# Patient Record
Sex: Male | Born: 1985 | Hispanic: No | Smoking: Never smoker
Health system: Southern US, Community
[De-identification: ages and names within clinical notes are randomized; demographics above are authoritative.]

## PROBLEM LIST (undated history)

## (undated) DIAGNOSIS — F419 Anxiety disorder, unspecified: Secondary | ICD-10-CM

## (undated) DIAGNOSIS — E785 Hyperlipidemia, unspecified: Secondary | ICD-10-CM

## (undated) HISTORY — DX: Anxiety disorder, unspecified: F41.9

## (undated) HISTORY — DX: Hyperlipidemia, unspecified: E78.5

---

## 2018-09-27 ENCOUNTER — Other Ambulatory Visit: Payer: Self-pay

## 2018-09-27 DIAGNOSIS — Z20822 Contact with and (suspected) exposure to covid-19: Secondary | ICD-10-CM

## 2018-09-28 LAB — NOVEL CORONAVIRUS, NAA: SARS-CoV-2, NAA: NOT DETECTED

## 2018-10-02 ENCOUNTER — Telehealth: Payer: Self-pay | Admitting: General Practice

## 2018-10-02 NOTE — Telephone Encounter (Signed)
Pt aware covid lab test negative, not detected °

## 2018-12-25 ENCOUNTER — Other Ambulatory Visit: Payer: Self-pay

## 2018-12-25 DIAGNOSIS — Z20822 Contact with and (suspected) exposure to covid-19: Secondary | ICD-10-CM

## 2018-12-27 LAB — NOVEL CORONAVIRUS, NAA: SARS-CoV-2, NAA: NOT DETECTED

## 2019-01-08 ENCOUNTER — Other Ambulatory Visit: Payer: Self-pay

## 2019-01-08 DIAGNOSIS — Z20822 Contact with and (suspected) exposure to covid-19: Secondary | ICD-10-CM

## 2019-01-10 LAB — NOVEL CORONAVIRUS, NAA: SARS-CoV-2, NAA: NOT DETECTED

## 2019-01-28 ENCOUNTER — Ambulatory Visit: Payer: Self-pay | Attending: Internal Medicine

## 2019-01-28 DIAGNOSIS — Z20822 Contact with and (suspected) exposure to covid-19: Secondary | ICD-10-CM

## 2019-01-29 LAB — NOVEL CORONAVIRUS, NAA: SARS-CoV-2, NAA: NOT DETECTED

## 2019-02-12 ENCOUNTER — Ambulatory Visit: Payer: 59 | Attending: Internal Medicine

## 2019-02-12 DIAGNOSIS — Z20822 Contact with and (suspected) exposure to covid-19: Secondary | ICD-10-CM | POA: Insufficient documentation

## 2019-02-14 LAB — NOVEL CORONAVIRUS, NAA: SARS-CoV-2, NAA: NOT DETECTED

## 2019-02-25 ENCOUNTER — Other Ambulatory Visit: Payer: Self-pay | Admitting: General Surgery

## 2019-02-25 DIAGNOSIS — R590 Localized enlarged lymph nodes: Secondary | ICD-10-CM

## 2019-03-01 ENCOUNTER — Other Ambulatory Visit: Payer: Self-pay

## 2019-03-01 ENCOUNTER — Ambulatory Visit
Admission: RE | Admit: 2019-03-01 | Discharge: 2019-03-01 | Disposition: A | Discharge status: Home / Self Care | Payer: 59 | Source: Ambulatory Visit | Attending: General Surgery | Admitting: General Surgery

## 2019-03-01 DIAGNOSIS — R590 Localized enlarged lymph nodes: Secondary | ICD-10-CM | POA: Diagnosis not present

## 2019-04-01 ENCOUNTER — Ambulatory Visit: Payer: 59 | Attending: Internal Medicine

## 2019-04-01 DIAGNOSIS — Z23 Encounter for immunization: Secondary | ICD-10-CM

## 2019-04-01 NOTE — Progress Notes (Signed)
   Covid-19 Vaccination Clinic  Name:  Alejandro Gamel    MRN: [id number] DOB: 20-Nov-1985  04/01/2019  Mr. Whistler was observed post Covid-19 immunization for 15 minutes without incidence. He was provided with Vaccine Information Sheet and instruction to access the V-Safe system.   Mr. Orsino was instructed to call 911 with any severe reactions post vaccine: Marland Kitchen Difficulty breathing  . Swelling of your face and throat  . A fast heartbeat  . A bad rash all over your body  . Dizziness and weakness    Immunizations Administered    Name Date Dose VIS Date Route   Moderna COVID-19 Vaccine 04/01/2019 10:05 AM 0.5 mL 01/08/2019 Intramuscular   Manufacturer: Moderna   Lot: [id number]   NDC: 00762-263-33

## 2019-04-15 ENCOUNTER — Other Ambulatory Visit: Payer: Self-pay | Admitting: Surgery

## 2019-04-15 DIAGNOSIS — R590 Localized enlarged lymph nodes: Secondary | ICD-10-CM

## 2019-04-22 ENCOUNTER — Other Ambulatory Visit: Payer: Self-pay

## 2019-04-22 ENCOUNTER — Ambulatory Visit
Admission: RE | Admit: 2019-04-22 | Discharge: 2019-04-22 | Disposition: A | Discharge status: Home / Self Care | Payer: 59 | Source: Ambulatory Visit | Attending: Surgery | Admitting: Surgery

## 2019-04-22 DIAGNOSIS — R590 Localized enlarged lymph nodes: Secondary | ICD-10-CM | POA: Diagnosis not present

## 2019-04-30 ENCOUNTER — Ambulatory Visit: Payer: 59 | Attending: Internal Medicine

## 2019-04-30 DIAGNOSIS — Z23 Encounter for immunization: Secondary | ICD-10-CM

## 2019-04-30 NOTE — Progress Notes (Signed)
   Covid-19 Vaccination Clinic  Name:  Micheal Ward    MRN: [id number] DOB: 06/23/1985  04/30/2019  [name] was observed post Covid-19 immunization for 15 minutes without incident. He was provided with Vaccine Information Sheet and instruction to access the V-Safe system.   [name] was instructed to call 911 with any severe reactions post vaccine: Marland Kitchen Difficulty breathing  . Swelling of face and throat  . A fast heartbeat  . A bad rash all over body  . Dizziness and weakness   Immunizations Administered    Name Date Dose VIS Date Route   Moderna COVID-19 Vaccine 04/30/2019 10:03 AM 0.5 mL 01/08/2019 Intramuscular   Manufacturer: Gala Murdoch   Lot: [id number]   NDC: 65790-383-33

## 2019-09-10 ENCOUNTER — Other Ambulatory Visit: Payer: Self-pay | Admitting: General Surgery

## 2019-09-10 DIAGNOSIS — R590 Localized enlarged lymph nodes: Secondary | ICD-10-CM

## 2019-09-13 ENCOUNTER — Other Ambulatory Visit: Payer: Self-pay

## 2019-09-13 ENCOUNTER — Ambulatory Visit
Admission: RE | Admit: 2019-09-13 | Discharge: 2019-09-13 | Disposition: A | Discharge status: Home / Self Care | Payer: BC Managed Care – PPO | Source: Ambulatory Visit | Attending: General Surgery | Admitting: General Surgery

## 2019-09-13 DIAGNOSIS — R590 Localized enlarged lymph nodes: Secondary | ICD-10-CM | POA: Insufficient documentation

## 2019-09-19 ENCOUNTER — Other Ambulatory Visit (HOSPITAL_COMMUNITY): Payer: Self-pay | Admitting: General Surgery

## 2019-09-19 ENCOUNTER — Other Ambulatory Visit: Payer: Self-pay | Admitting: General Surgery

## 2019-09-19 DIAGNOSIS — R590 Localized enlarged lymph nodes: Secondary | ICD-10-CM

## 2019-09-20 ENCOUNTER — Other Ambulatory Visit (HOSPITAL_COMMUNITY): Payer: Self-pay | Admitting: General Surgery

## 2019-09-26 ENCOUNTER — Other Ambulatory Visit: Payer: Self-pay

## 2019-09-26 ENCOUNTER — Ambulatory Visit
Admission: RE | Admit: 2019-09-26 | Discharge: 2019-09-26 | Disposition: A | Discharge status: Home / Self Care | Payer: BC Managed Care – PPO | Source: Ambulatory Visit | Attending: General Surgery | Admitting: General Surgery

## 2019-09-26 DIAGNOSIS — R590 Localized enlarged lymph nodes: Secondary | ICD-10-CM | POA: Diagnosis not present

## 2019-09-26 MED ORDER — IOHEXOL 300 MG/ML  SOLN
100.0000 mL | Freq: Once | INTRAMUSCULAR | Status: AC | PRN
  Administered 2019-09-26: 100 mL via INTRAVENOUS

## 2019-11-21 ENCOUNTER — Encounter: Payer: Self-pay | Admitting: Psychiatry

## 2019-11-21 ENCOUNTER — Other Ambulatory Visit: Payer: Self-pay

## 2019-11-21 ENCOUNTER — Telehealth (INDEPENDENT_AMBULATORY_CARE_PROVIDER_SITE_OTHER): Payer: BC Managed Care – PPO | Admitting: Psychiatry

## 2019-11-21 DIAGNOSIS — R41840 Attention and concentration deficit: Secondary | ICD-10-CM

## 2019-11-21 DIAGNOSIS — F411 Generalized anxiety disorder: Secondary | ICD-10-CM

## 2019-11-21 NOTE — Progress Notes (Signed)
Provider Location : [facility] Patient Location : Work  Participants: Patient , Provider  Virtual Visit via Video Note  I connected with Micheal Ward on 11/21/19 at  9:00 AM EDT by a video enabled telemedicine application and verified that I am speaking with the correct person using two identifiers.   I discussed the limitations of evaluation and management by telemedicine and the availability of in person appointments. The patient expressed understanding and agreed to proceed.    I discussed the assessment and treatment plan with the patient. The patient was provided an opportunity to ask questions and all were answered. The patient agreed with the plan and demonstrated an understanding of the instructions.   The patient was advised to call back or seek an in-person evaluation if the symptoms worsen or if the condition fails to improve as anticipated.    Psychiatric Initial Adult Assessment   Patient Identification: Micheal Ward MRN:  [id number] Date of Evaluation:  11/21/2019 Referral Source: Jerl Mina MD Chief Complaint:   Chief Complaint    Establish Care     Visit Diagnosis:    ICD-10-CM   1. Attention and concentration deficit  R41.840 TSH  2. Generalized anxiety disorder  F41.1 TSH    History of Present Illness:  Micheal Ward is a 34 year old Caucasian male, divorced, employed, also a  Consulting civil engineer, lives in [place], has a history of generalized anxiety disorder, attention and concentration deficit, allergic rhinitis, hyperlipidemia was evaluated by telemedicine today.  Patient today reports he was diagnosed with generalized anxiety disorder 3 years ago.  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  Patient reports he was tried on multiple medications in the past including Lexapro, Zoloft as well as Wellbutrin.  Patient however stopped all his medication in February 2021.  Patient reports he is able to manage his anxiety symptoms well with  meditation and relaxation techniques and it does not affect his functioning.  Patient however today reports he is currently struggling with a lot of attention and focus problems.  Patient reports he has difficulty paying attention even when someone speaks to him directly, being forgetful, his mind wanders often, procrastination and so on.  Patient reports he also had similar problems growing up in elementary school and middle school.  Patient reports he used to daydream a lot and had difficulty doing his homework.  Patient however reports he was never tested for ADHD.  He is currently enrolled in a masters program as well as works a full-time job.  Patient  would like to get tested for ADHD and wants to be managed.  Patient denies any significant depressive symptoms at this time.  Patient denies any suicidality, homicidality or perceptual disturbances.  Patient denies any history of trauma.  Patient denies any manic or hypomanic symptoms.  Patient denies any substance abuse problems.   Associated Signs/Symptoms: Depression Symptoms:  anxiety, (Hypo) Manic Symptoms:  Denies Anxiety Symptoms:  Excessive Worry, Psychotic Symptoms:  Denies PTSD Symptoms: Negative  Past Psychiatric History: Patient with past history of GAD.  Patient denies suicide attempts.  Patient denies inpatient mental health admissions.  Previous Psychotropic Medications: Yes He was treated for generalized anxiety disorder by his primary care provider 3 years ago-Lexapro, sertraline, Wellbutrin.  Substance Abuse History in the last 12 months:  No.  Consequences of Substance Abuse: Negative  Past Medical History:  Past Medical History:  Diagnosis Date  . Anxiety   . Hyperlipidemia    History reviewed. No pertinent  surgical history.  Family Psychiatric History: Sister-depression  Family History:  Family History  Problem Relation Age of Onset  . Depression Sister     Social History:   Social History    Socioeconomic History  . Marital status: Divorced    Spouse name: Not on file  . Number of children: Not on file  . Years of education: Not on file  . Highest education level: Not on file  Occupational History  . Not on file  Tobacco Use  . Smoking status: Never Smoker  . Smokeless tobacco: Never Used  Substance and Sexual Activity  . Alcohol use: Not on file  . Drug use: Not on file  . Sexual activity: Not on file  Other Topics Concern  . Not on file  Social History Narrative  . Not on file   Social Determinants of Health   Financial Resource Strain:   . Difficulty of Paying Living Expenses: Not on file  Food Insecurity:   . Worried About Programme researcher, broadcasting/film/video in the Last Year: Not on file  . Ran Out of Food in the Last Year: Not on file  Transportation Needs:   . Lack of Transportation (Medical): Not on file  . Lack of Transportation (Non-Medical): Not on file  Physical Activity:   . Days of Exercise per Week: Not on file  . Minutes of Exercise per Session: Not on file  Stress:   . Feeling of Stress : Not on file  Social Connections:   . Frequency of Communication with Friends and Family: Not on file  . Frequency of Social Gatherings with Friends and Family: Not on file  . Attends Religious Services: Not on file  . Active Member of Clubs or Organizations: Not on file  . Attends Banker Meetings: Not on file  . Marital Status: Not on file    Additional Social History: Patient was raised by both parents.  He reports he had a very good childhood.  He has a lot of pleasant childhood memories and calls himself fortunate.  Patient currently is enrolled in a masters program.  He currently works as a Merchandiser, retail.  Patient is divorced.  He has no children.  He is currently in a relationship and reports that is going well.  He lives in [place].  He denies any legal problems.  He denies any history of trauma.  Allergies:  No Known  Allergies  Metabolic Disorder Labs: No results found for: HGBA1C, MPG No results found for: PROLACTIN No results found for: CHOL, TRIG, HDL, CHOLHDL, VLDL, LDLCALC No results found for: TSH  Therapeutic Level Labs: No results found for: LITHIUM No results found for: CBMZ No results found for: VALPROATE  Current Medications: Current Outpatient Medications  Medication Sig Dispense Refill  . chlorhexidine (PERIDEX) 0.12 % solution 15 mLs 2 (two) times daily.    . fenofibrate (TRICOR) 145 MG tablet Take by mouth.    Marland Kitchen HYDROcodone-acetaminophen (NORCO/VICODIN) 5-325 MG tablet Take by mouth.    . lansoprazole (PREVACID) 30 MG capsule Take by mouth.    . montelukast (SINGULAIR) 10 MG tablet Take 10 mg by mouth daily.    Marland Kitchen triamcinolone cream (KENALOG) 0.1 % Apply topically 2 (two) times daily.     No current facility-administered medications for this visit.    Musculoskeletal: Strength & Muscle Tone: UTA Gait & Station: normal Patient leans: N/A  Psychiatric Specialty Exam: Review of Systems  Psychiatric/Behavioral: Positive for decreased concentration. The patient  is nervous/anxious.   All other systems reviewed and are negative.   There were no vitals taken for this visit.There is no height or weight on file to calculate BMI.  General Appearance: Casual  Eye Contact:  Fair  Speech:  Clear and Coherent  Volume:  Normal  Mood:  Anxious  Affect:  Congruent  Thought Process:  Goal Directed and Descriptions of Associations: Intact  Orientation:  Full (Time, Place, and Person)  Thought Content:  Logical  Suicidal Thoughts:  No  Homicidal Thoughts:  No  Memory:  Immediate;   Fair Recent;   Fair Remote;   Fair  Judgement:  Fair  Insight:  Fair  Psychomotor Activity:  Normal  Concentration:  Concentration: Fair and Attention Span: Fair  Recall:  [employer] of Knowledge:Fair  Language: Fair  Akathisia:  No  Handed:  Right  AIMS (if indicated):  UTA  Assets:   Communication Skills Desire for Improvement Housing Social Support  ADL's:  Intact  Cognition: WNL  Sleep:  Fair   Screenings: GAD-7     Video Visit from 11/21/2019 in [facility]  Total GAD-7 Score 7      Assessment and Plan: Stclair Szymborski is a 34 year old Caucasian male, employed, currently enrolled in a masters program, divorced, lives in [place], has a history of anxiety, attention and focus deficit, allergic rhinitis, hyperlipidemia was evaluated by telemedicine today.  Patient currently struggles with attention and focus problems, which may have started in elementary school, currently getting worse.  Patient will benefit from ADHD testing.  Plan as noted below.  Plan Attention and focus deficit-unstable Patient completed adult ADHD rating scale-scored high on the same. Will refer for ADHD testing today.   GAD-stable Patient currently is not on medications and managing his anxiety with meditation, relaxation techniques. He is not interested in medications today. GAD 7 today equals 7  We will order labs-TSH.  Follow-up in clinic in 4 to 6 weeks after ADHD testing.  I have spent atleast 35 minutes face to face by video with patient today. More than 50 % of the time was spent for preparing to see the patient ( e.g., review of test, records ), ordering medications and test ,psychoeducation and supportive psychotherapy and care coordination,as well as documenting clinical information in electronic health record. This note was generated in part or whole with voice recognition software. Voice recognition is usually quite accurate but there are transcription errors that can and very often do occur. I apologize for any typographical errors that were not detected and corrected.          Jomarie Longs, MD 10/14/20219:49 AM

## 2019-12-02 ENCOUNTER — Ambulatory Visit: Payer: BC Managed Care – PPO

## 2020-01-13 ENCOUNTER — Telehealth: Payer: BC Managed Care – PPO | Admitting: Psychiatry

## 2021-04-22 IMAGING — US US EXTREM LOW*R* LIMITED
1 series · 11 of 11 positions shown · non-contrast
Comparison: None.

CLINICAL DATA: Right inguinal adenopathy.

EXAM:
ULTRASOUND right LOWER EXTREMITY LIMITED
TECHNIQUE: Ultrasound examination of the lower extremity soft tissues was
performed in the area of clinical concern.

[Series 1: us extrem low*right* limited · 0.05mm/px · 11 acquisitions, 11 frames shown]
[im 1/11]
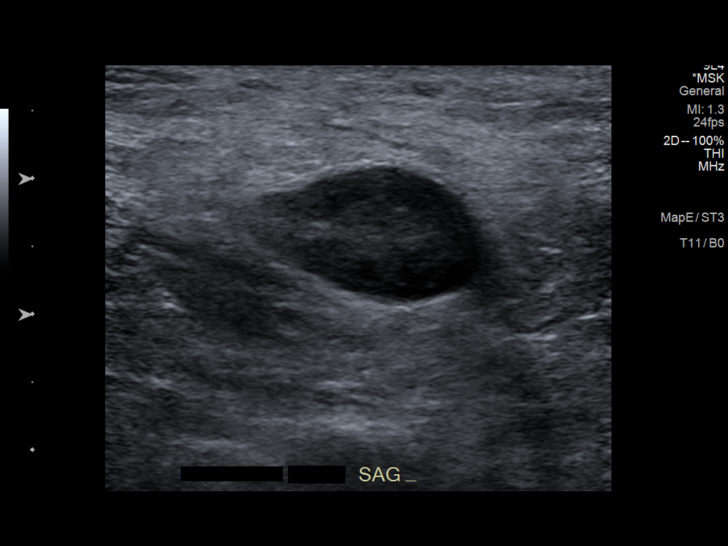
[im 2/11]
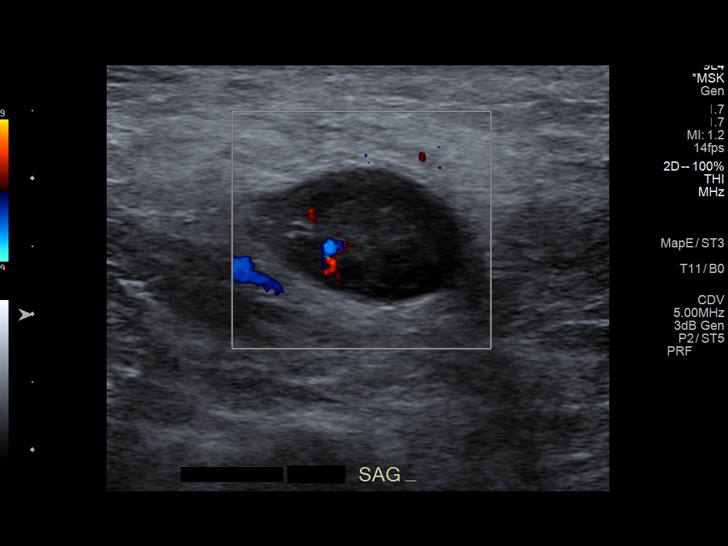
[im 3/11]
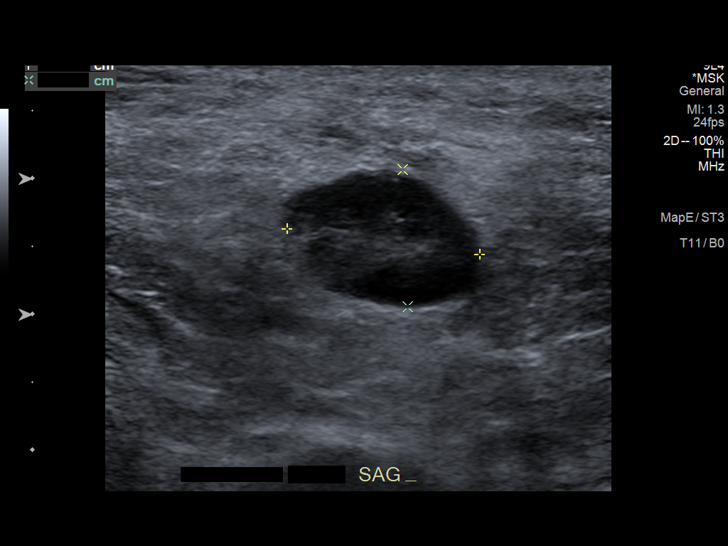
[im 4/11]
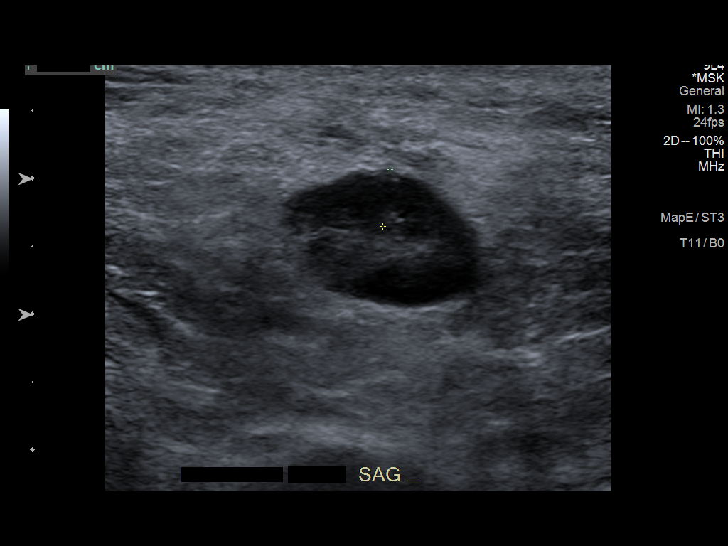
[im 5/11]
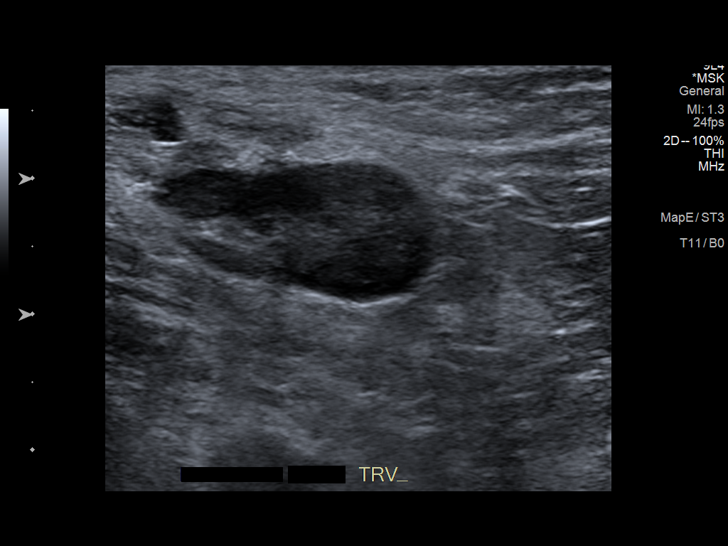
[im 6/11]
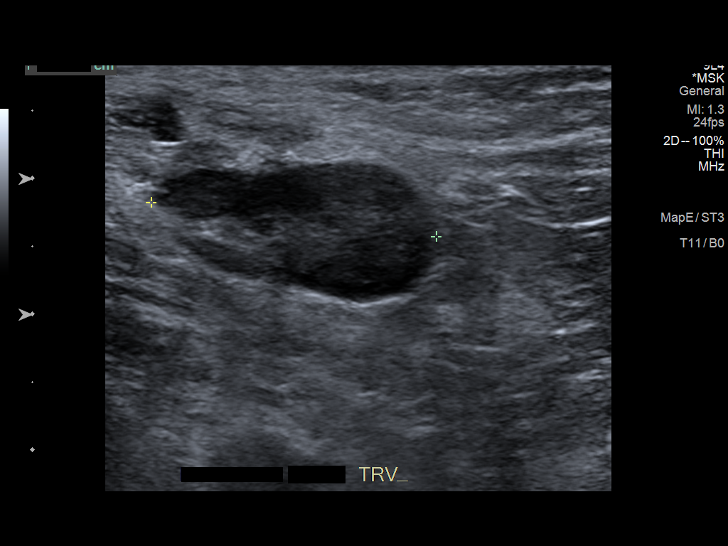
[im 7/11]
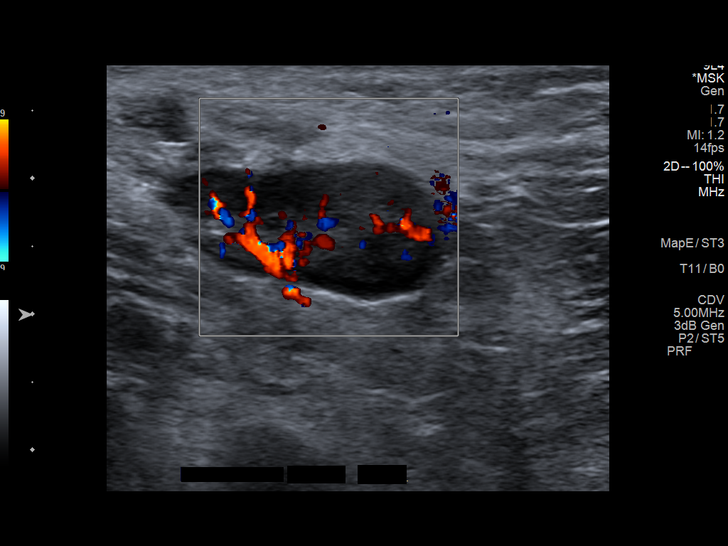
[im 8/11]
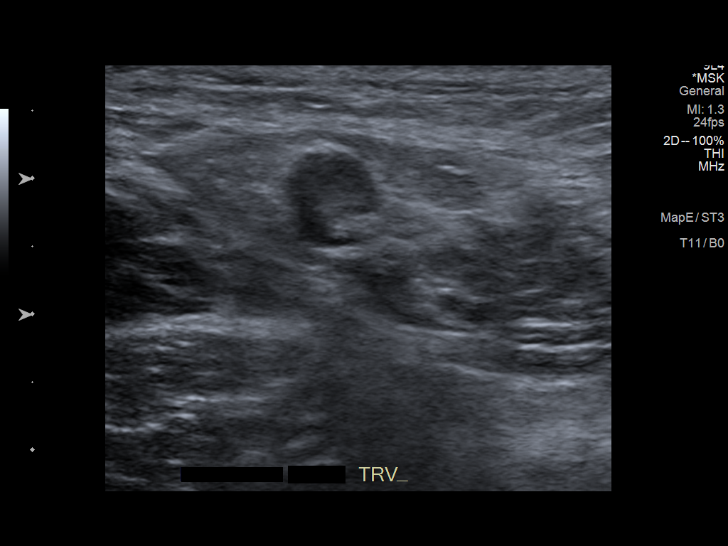
[im 9/11]
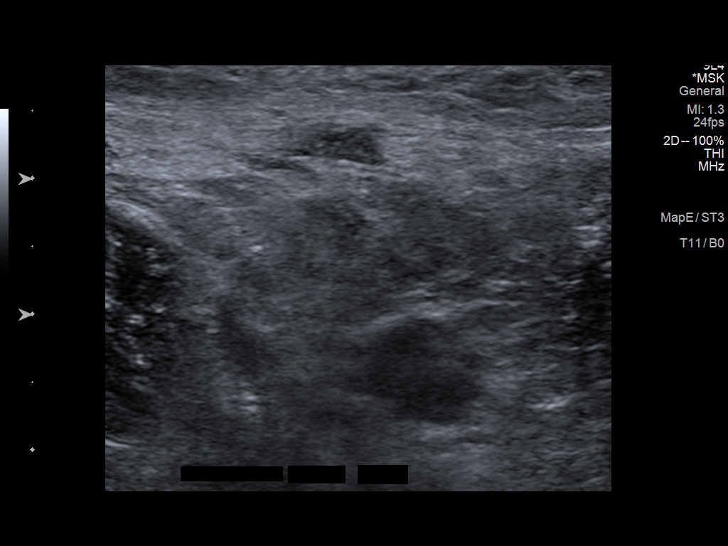
[im 10/11]
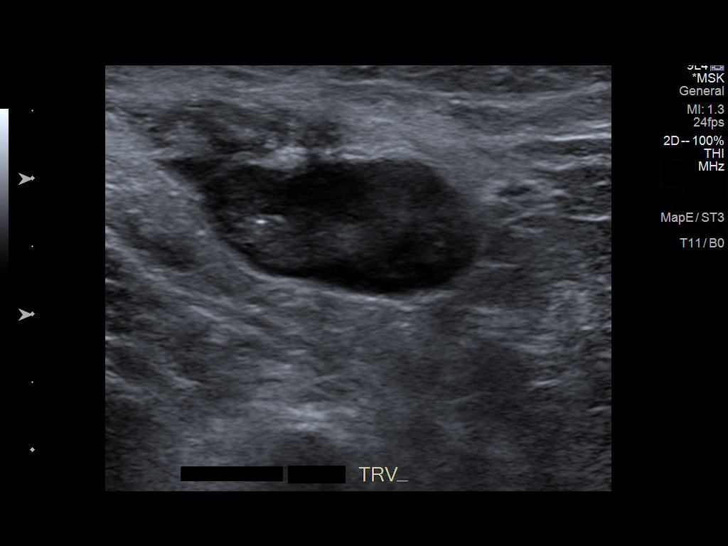
[im 11/11]
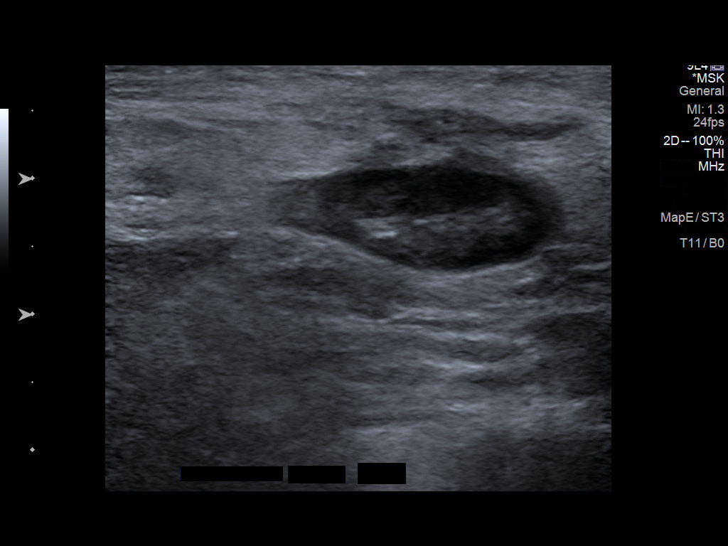

[11 of 11 positions shown; findings below may reference images not displayed]

FINDINGS: Limited sonographic evaluation in area of palpable concern in right
inguinal region demonstrates solitary lymph node measuring 2.1 x
x 1.0 cm. It appears to have a fatty hilum. No other abnormality is
noted.
IMPRESSION: Solitary lymph node with minor axis of 1 cm is noted in area of
palpable concern in right inguinal region. This most likely is
benign or inflammatory in etiology, but follow-up ultrasound in 3-6
months is recommended to ensure stability or resolution to rule out
neoplasm.

## 2021-06-13 IMAGING — US US EXTREM LOW*R* LIMITED
1 series · 14 of 14 positions shown · non-contrast
Comparison: None.

CLINICAL DATA: Inguinal adenopathy

EXAM:
ULTRASOUND RIGHT LOWER EXTREMITY LIMITED
TECHNIQUE: Ultrasound examination of the lower extremity soft tissues was
performed in the area of clinical concern.

[Series 1: us extrem low*right* limited · 0.06mm/px · 14 of 14 slices shown]
[im 1/14]
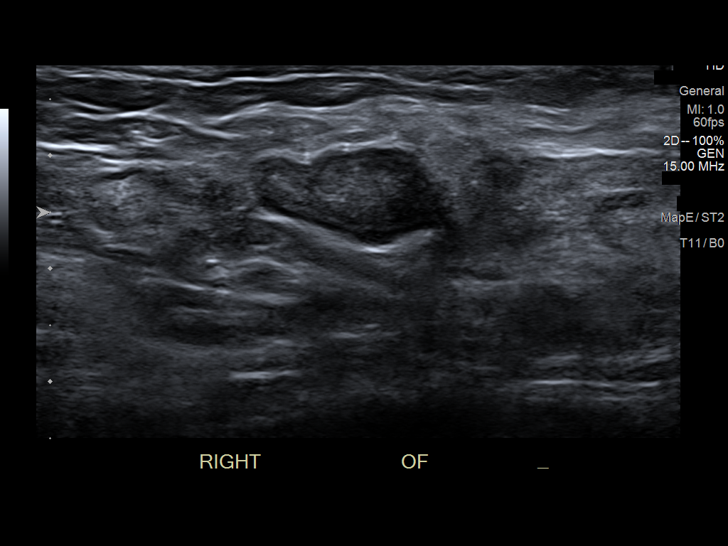
[im 2/14]
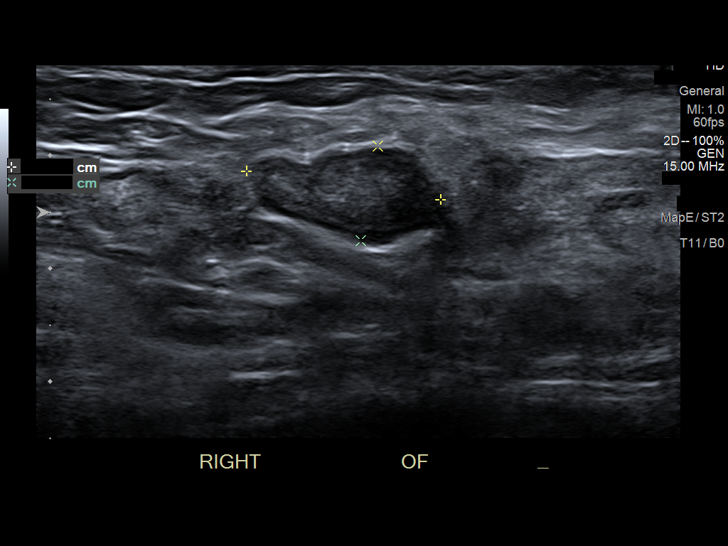
[im 3/14]
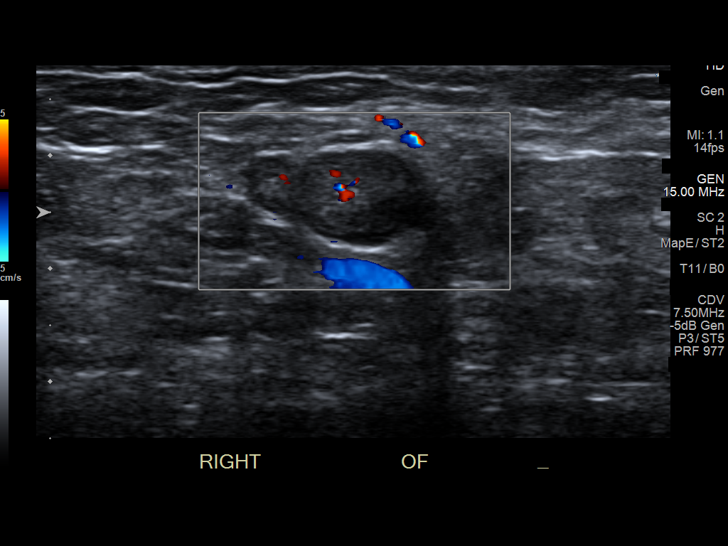
[im 4/14]
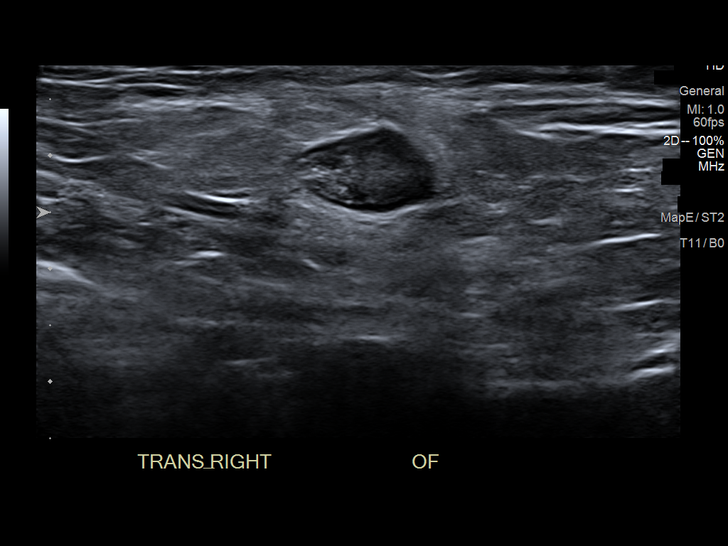
[im 5/14]
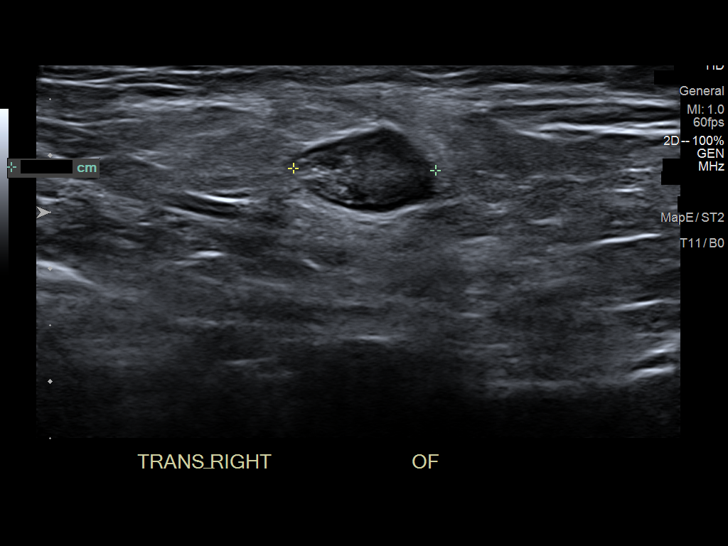
[im 6/14]
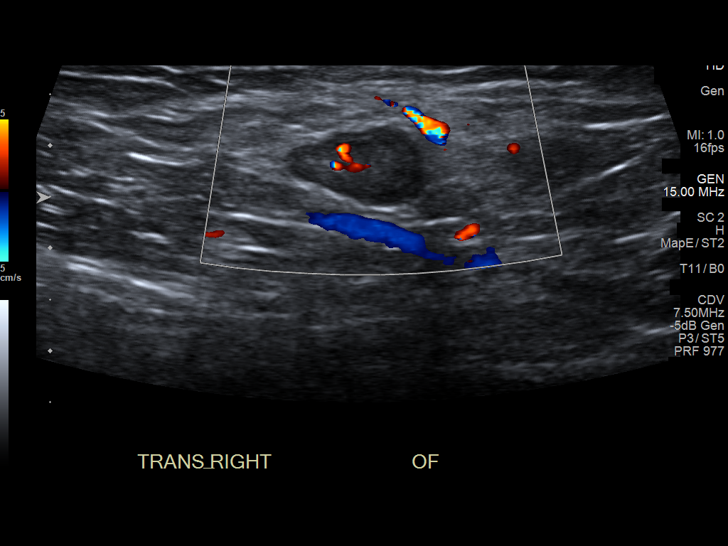
[im 7/14]
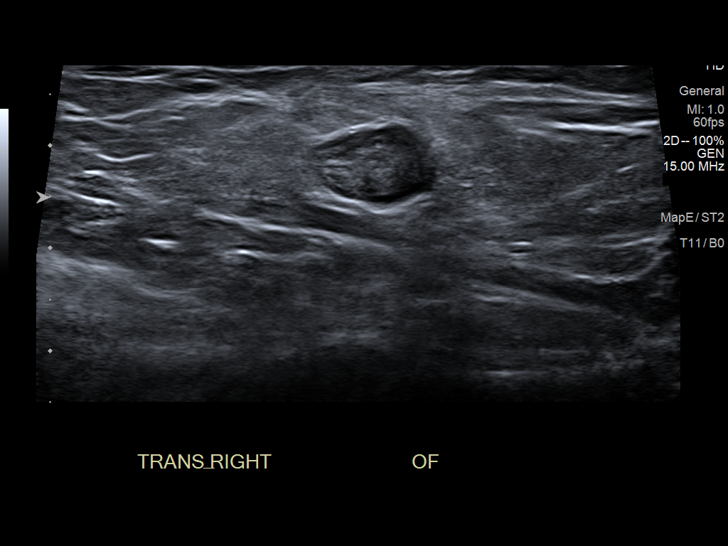
[im 8/14]
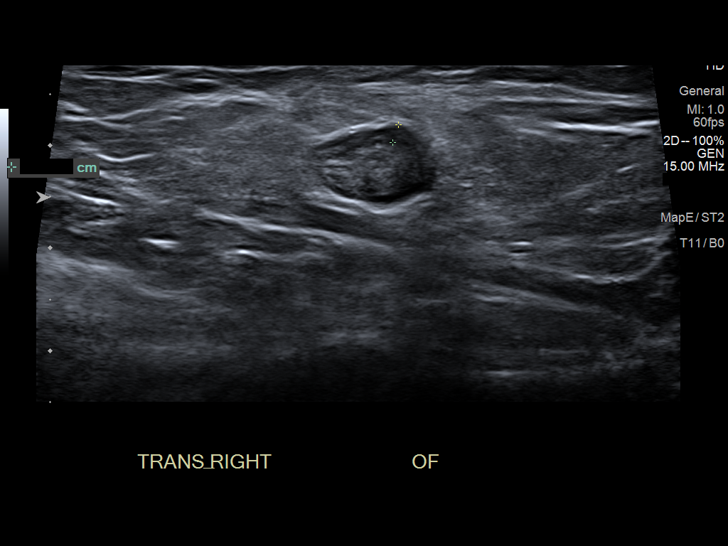
[im 9/14]
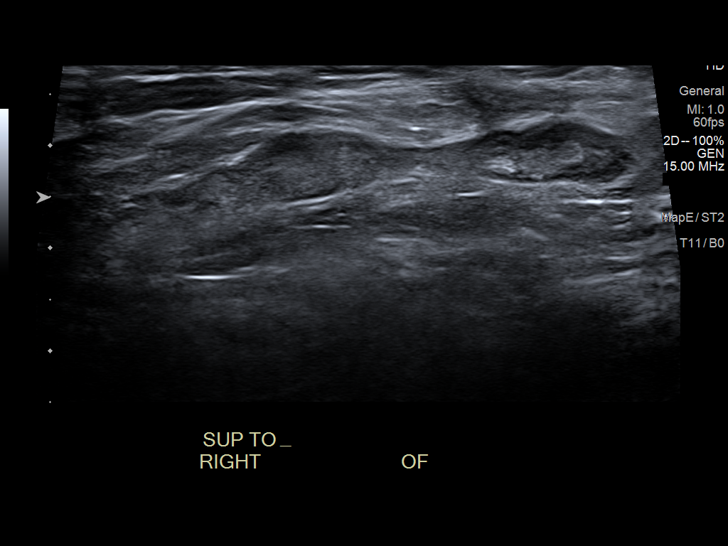
[im 10/14]
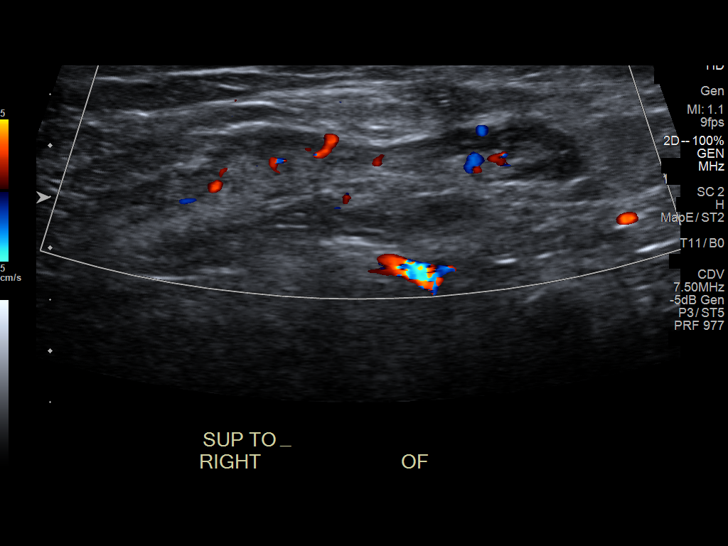
[im 11/14]
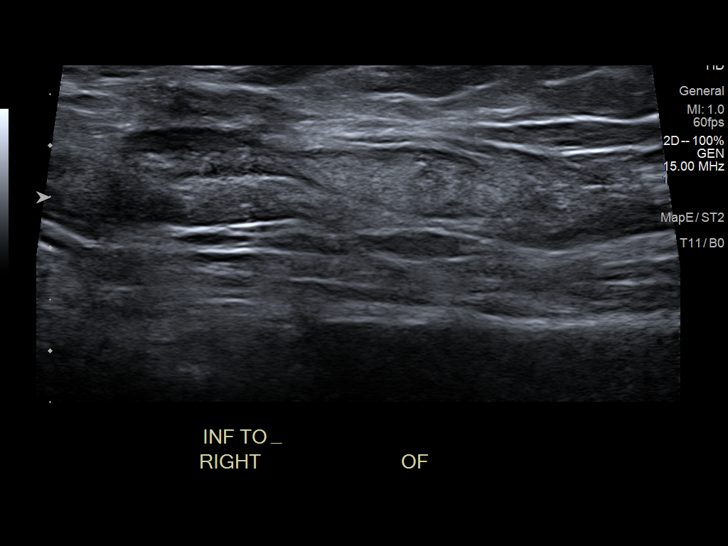
[im 12/14]
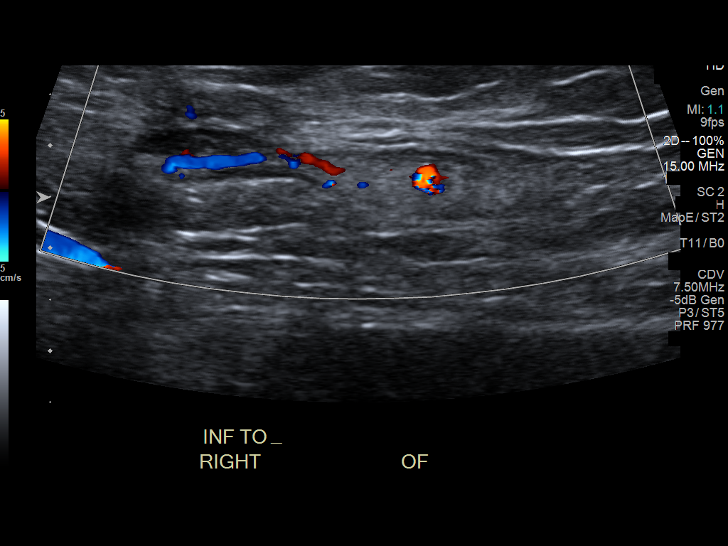
[im 13/14]
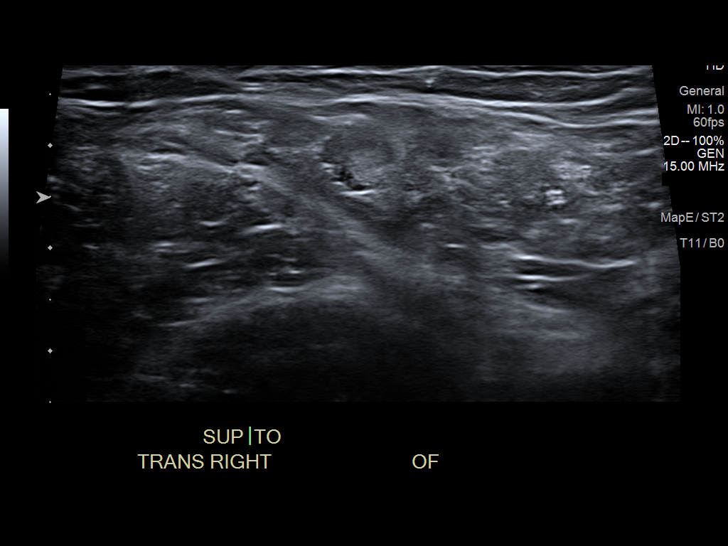
[im 14/14]
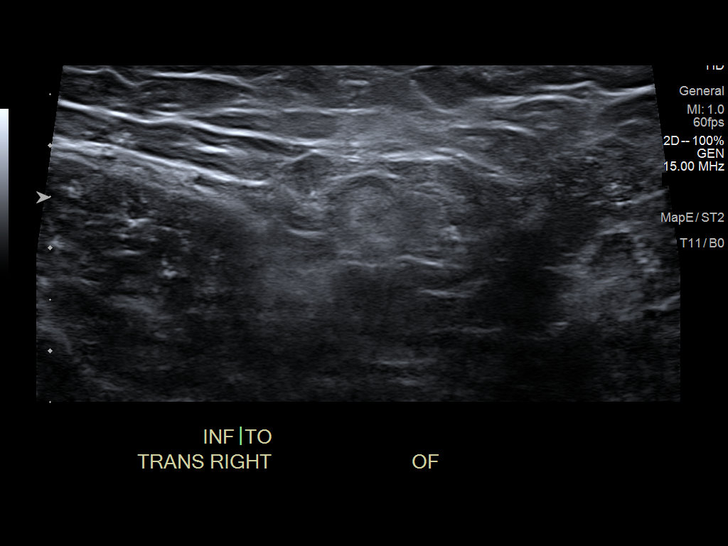

[14 of 14 positions shown; findings below may reference images not displayed]

FINDINGS: Right inguinal lymph node is noted, measuring 1.7 x 1.3 x 0.9 cm.
This previously measured 2.1 x 1.4 x 1.0 cm. This maintains a normal
central fatty hilum.
IMPRESSION: Right inguinal lymph node with a short axis diameter of 0.9 cm. This
maintains a normal central fatty hilum and is slightly decreased in
size since prior study. This likely inflammatory.

## 2021-11-04 IMAGING — US US EXTREM LOW*R* LIMITED
1 series · 14 of 24 positions shown · non-contrast
Comparison: April 22, 2019.

CLINICAL DATA: Right inguinal lymph node.

EXAM:
ULTRASOUND right LOWER EXTREMITY LIMITED
TECHNIQUE: Ultrasound examination of the lower extremity soft tissues was
performed in the area of clinical concern.

[Series 1: us extrem low*right* limited · 0.07mm/px · 24 acquisitions, 14 frames shown]
[im 1/24]
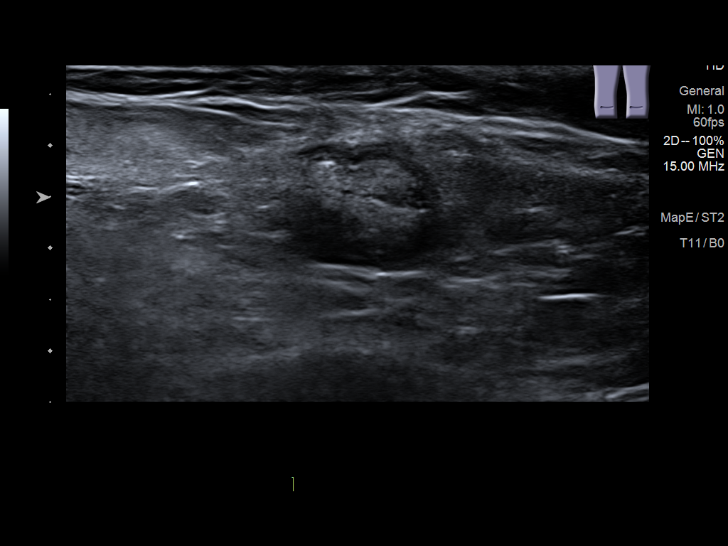
[im 3/24]
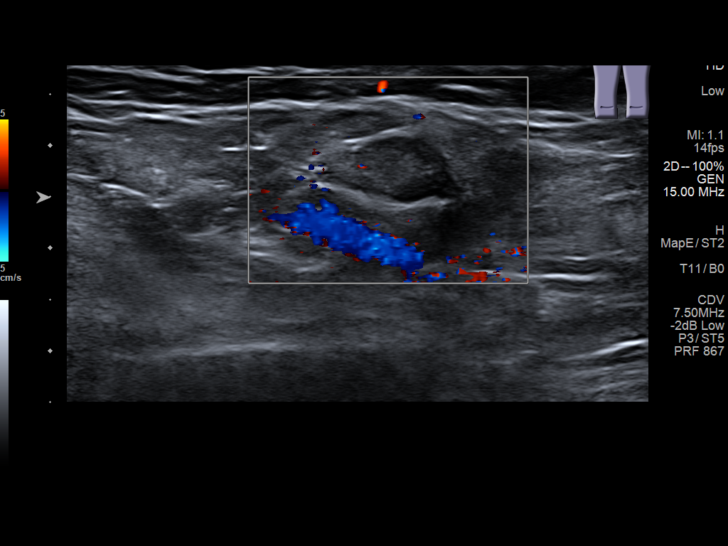
[im 5/24]
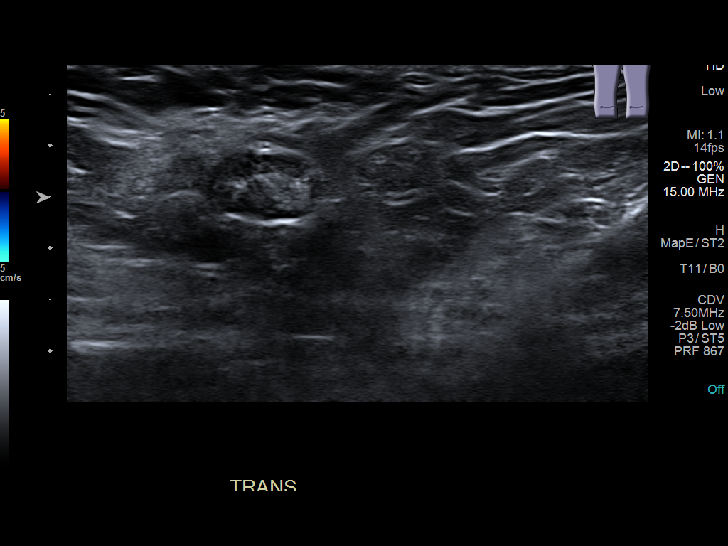
[im 7/24]
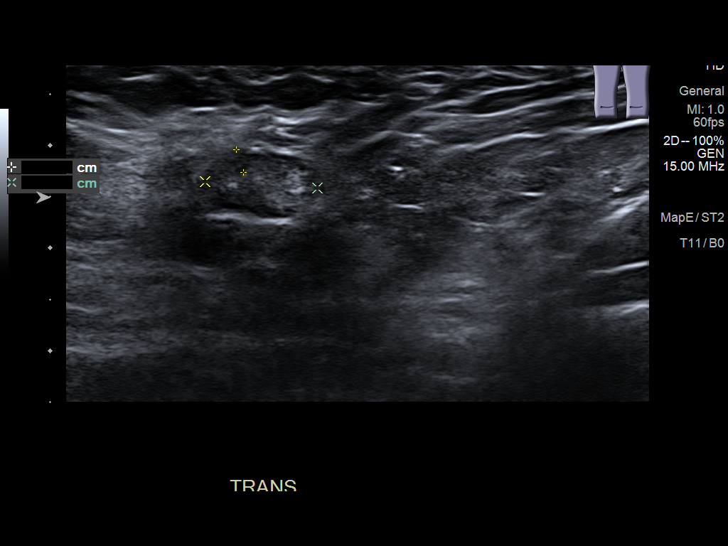
[im 8/24]
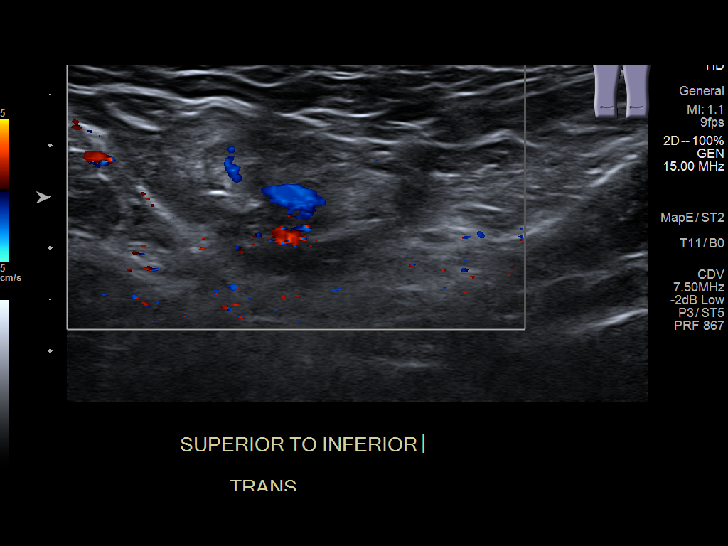
[im 10/24]
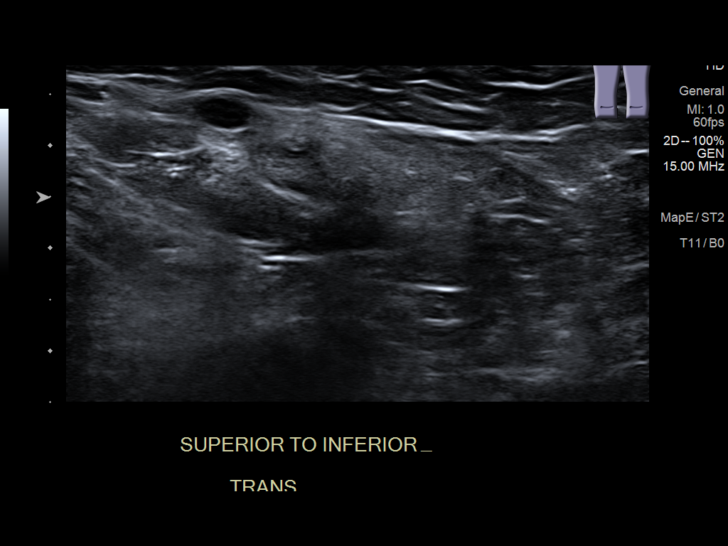
[im 12/24]
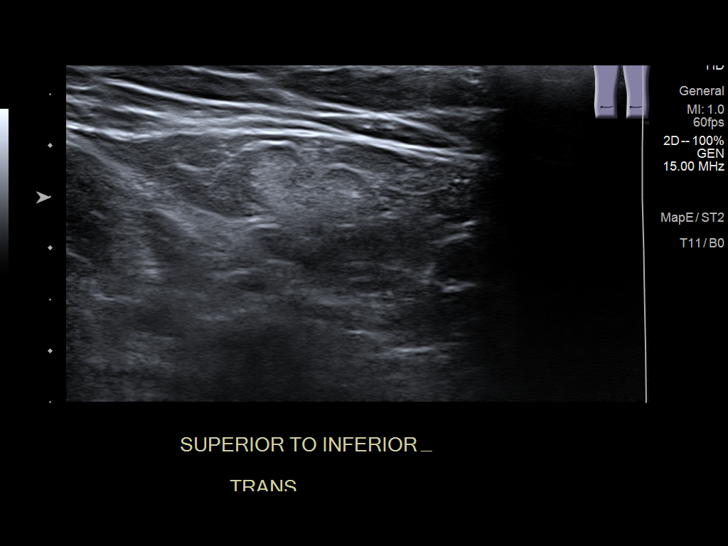
[im 13/24]
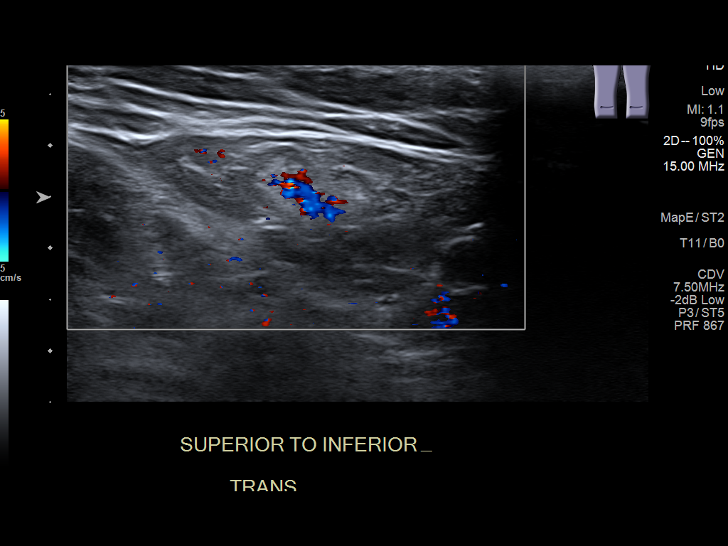
[im 15/24]
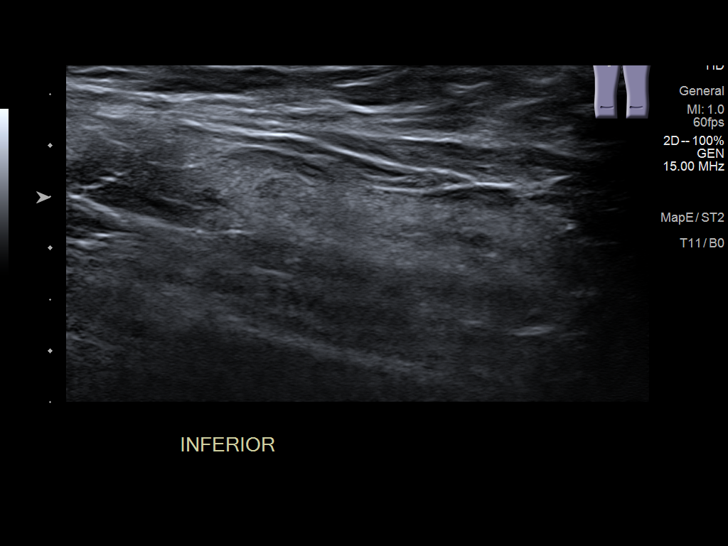
[im 17/24]
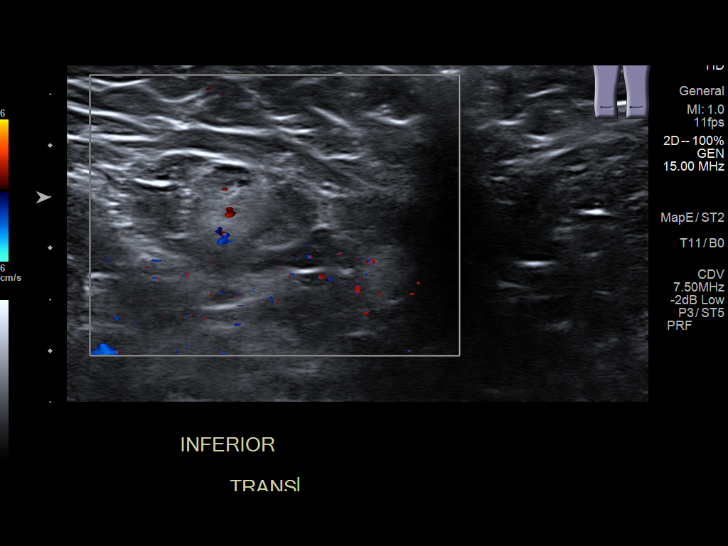
[im 19/24]
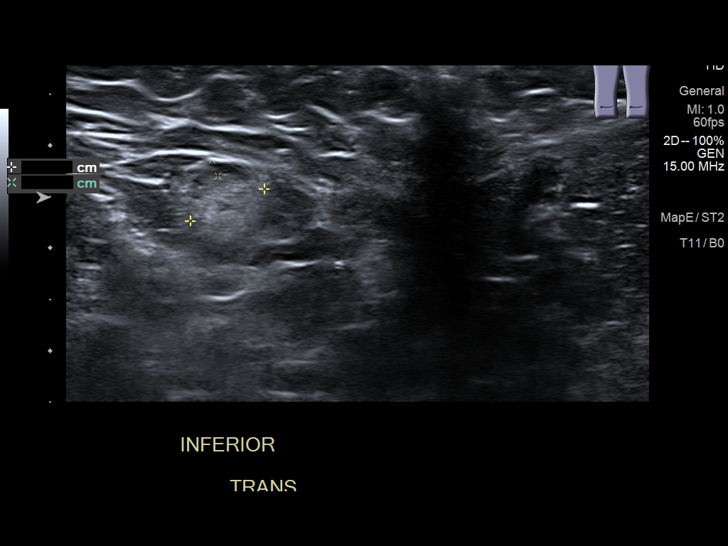
[im 20/24]
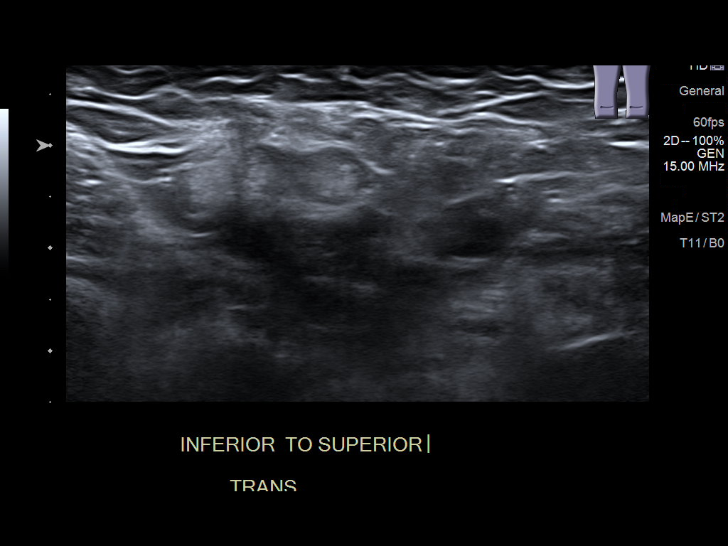
[im 22/24]
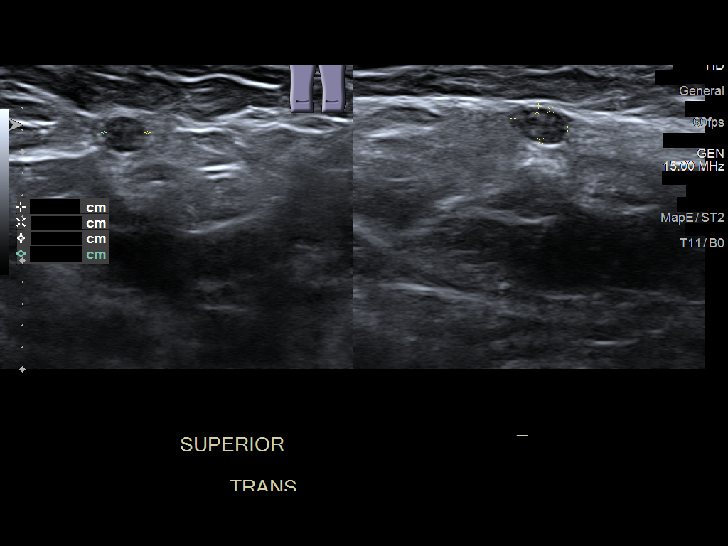
[im 24/24]
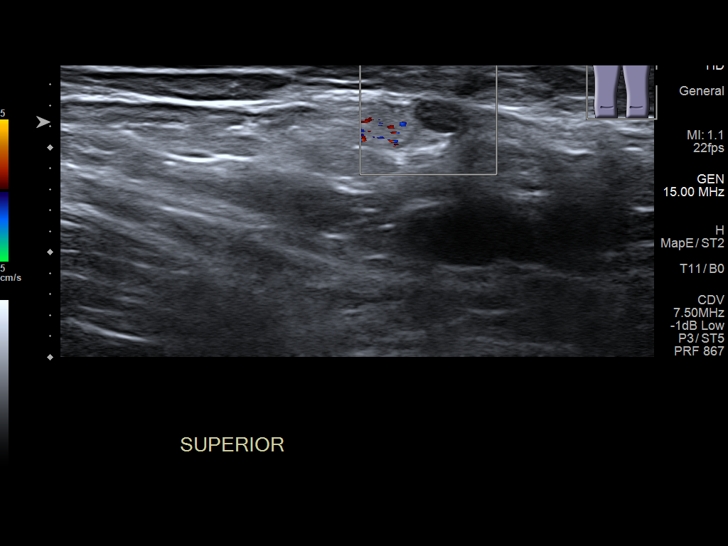

[14 of 24 positions shown; findings below may reference images not displayed]

FINDINGS: Limited sonographic evaluation was performed of the right inguinal
region. This demonstrates lymph node with fatty hilum in the right
groin region which measures 1.4 x 1.2 x 0.6 cm, which most likely is
reactive or inflammatory in etiology. However, there appears to be
another larger lymph node that measures 3.6 x 1.1 cm in size and
appears to have a more indistinct or ill-defined hilum. It does
demonstrate some flow on color doppler.
IMPRESSION: Two lymph nodes are noted in the right inguinal region, with the
largest measuring 3.6 x 1.1 x 1.1 cm. This particular lymph node
appears to be more indistinct in its hilar margins, and further
evaluation with CT scan of the pelvis with intravenous contrast is
recommended.

## 2021-11-17 IMAGING — CT CT PELVIS W/ CM
2 of 3 series · 17 of 46 positions shown, 19 images · IV contrast (omnipaque)
Comparison: Several prior ultrasounds dating back to 03/01/2019.

CLINICAL DATA: 34-year-old male for evaluation of the right
inguinal lymph node.

EXAM:
CT PELVIS WITH CONTRAST
TECHNIQUE: Multidetector CT imaging of the pelvis was performed using the
standard protocol following the bolus administration of intravenous
contrast.
CONTRAST:  100mL OMNIPAQUE IOHEXOL 300 MG/ML  SOLN

[Series 2: pelvis 5.00 · axial · 0.93mm/px · z∈[+1343,+1628]mm · 14 of 67 slices shown, 16 images]
[im 5/67  soft-tissue]
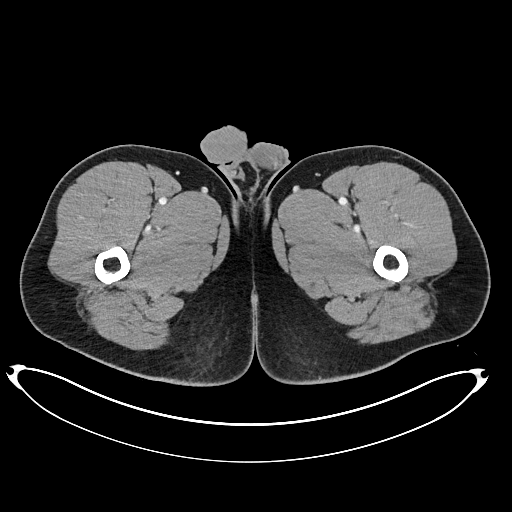
[im 5/67  bone]
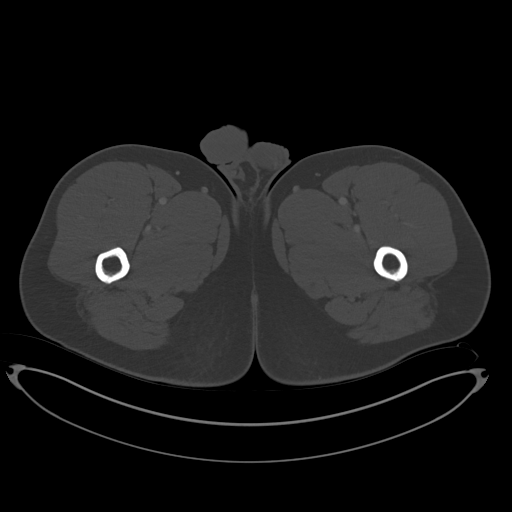
[im 9/67  soft-tissue]
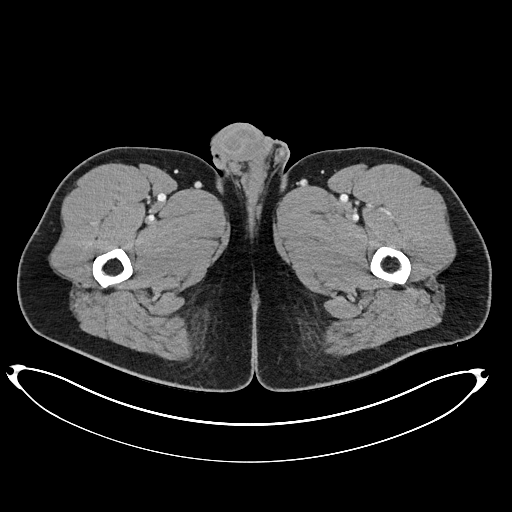
[im 13/67  soft-tissue]
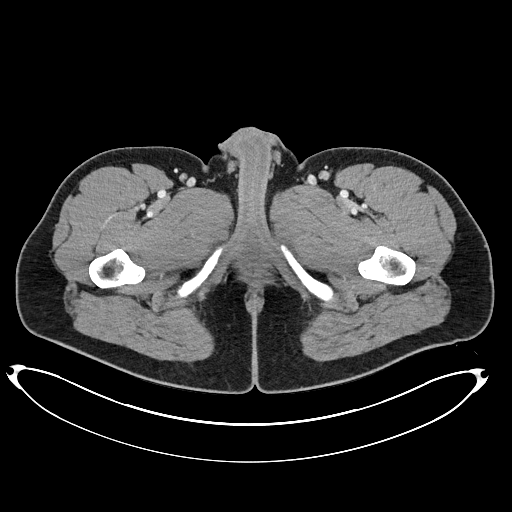
[im 18/67  soft-tissue]
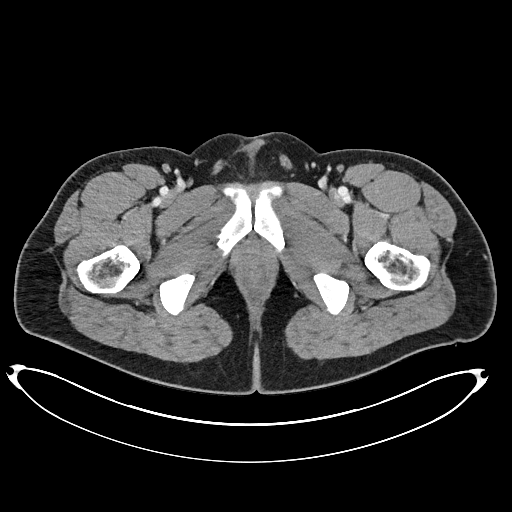
[im 22/67  soft-tissue]
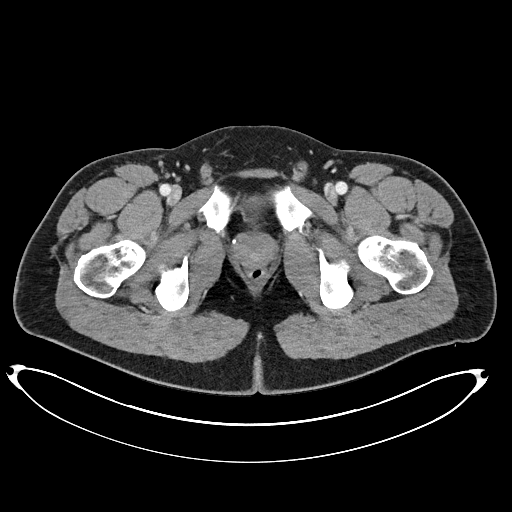
[im 26/67  soft-tissue]
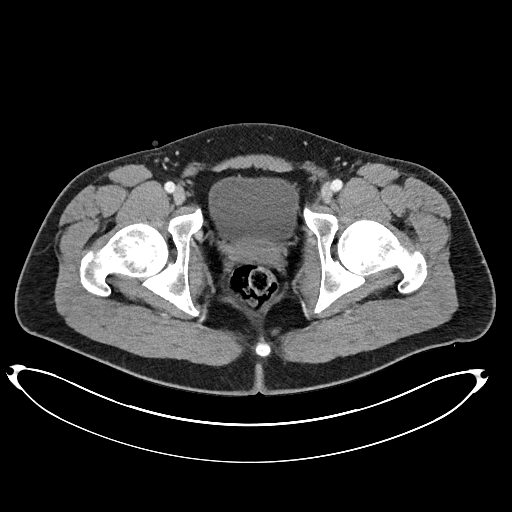
[im 30/67  soft-tissue]
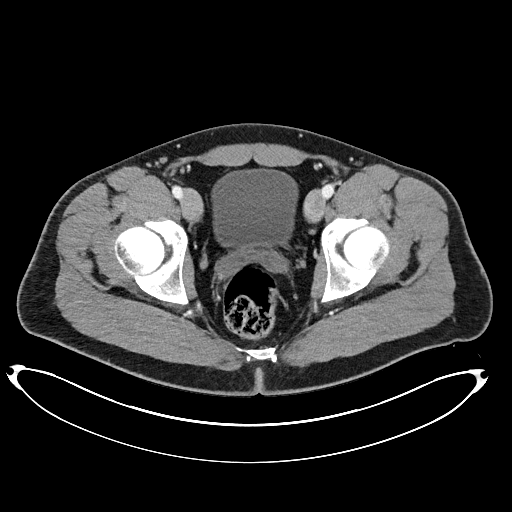
[im 37/67  soft-tissue]
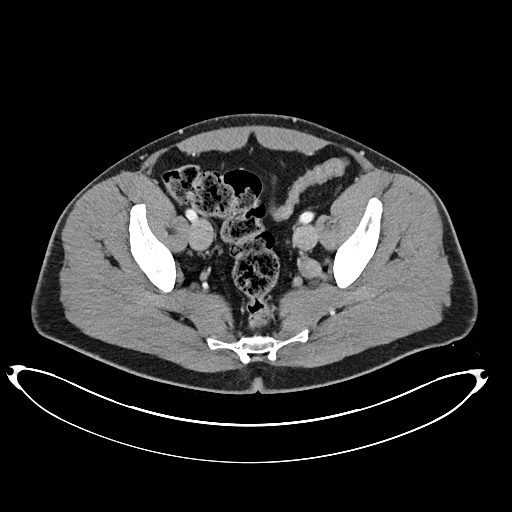
[im 41/67  soft-tissue]
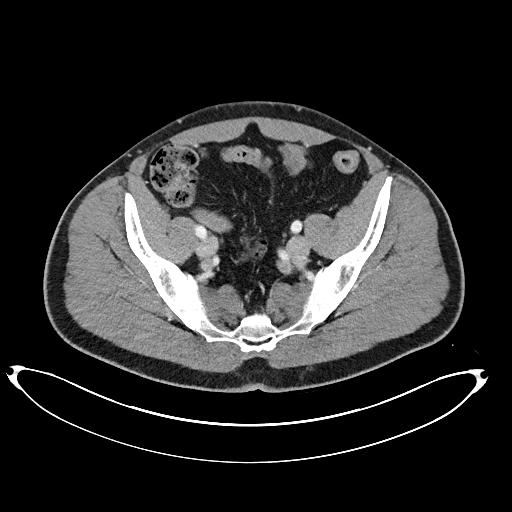
[im 41/67  bone]
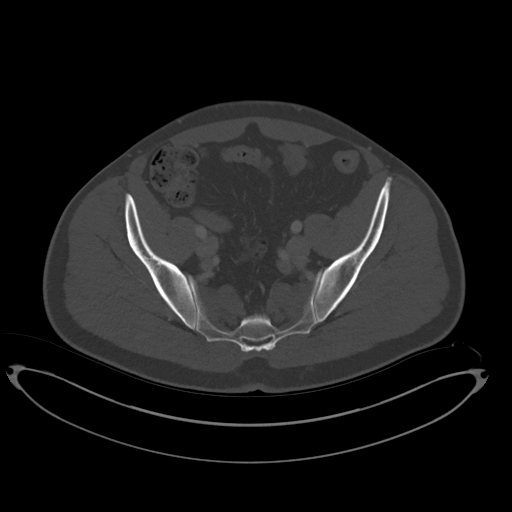
[im 45/67  soft-tissue]
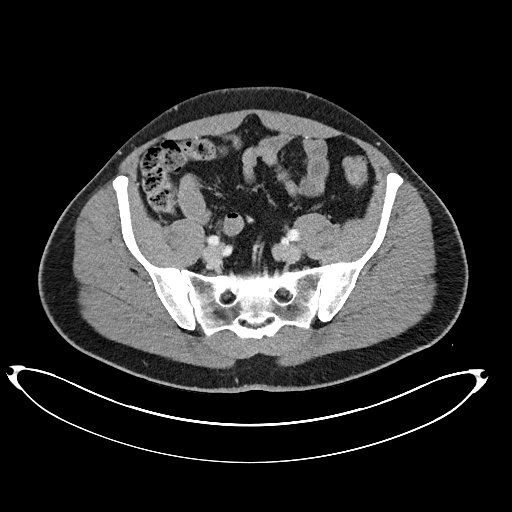
[im 49/67  soft-tissue]
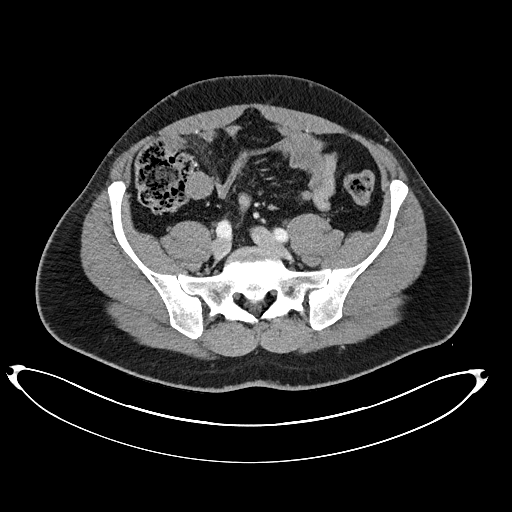
[im 54/67  soft-tissue]
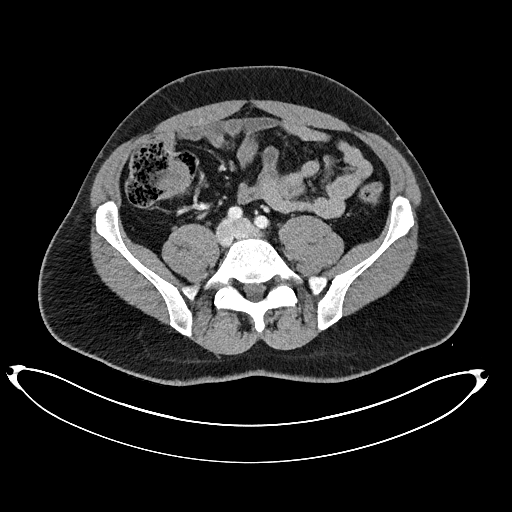
[im 58/67  soft-tissue]
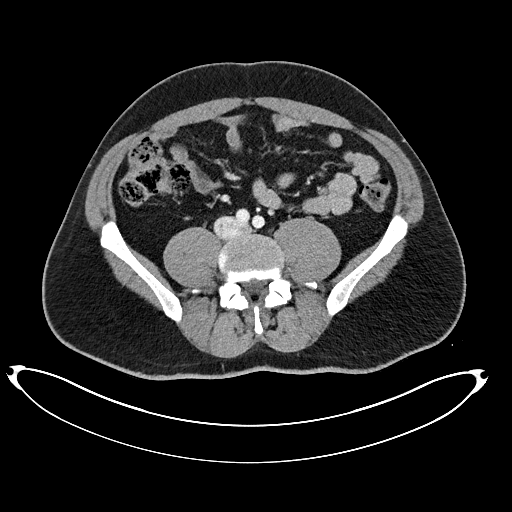
[im 62/67  soft-tissue]
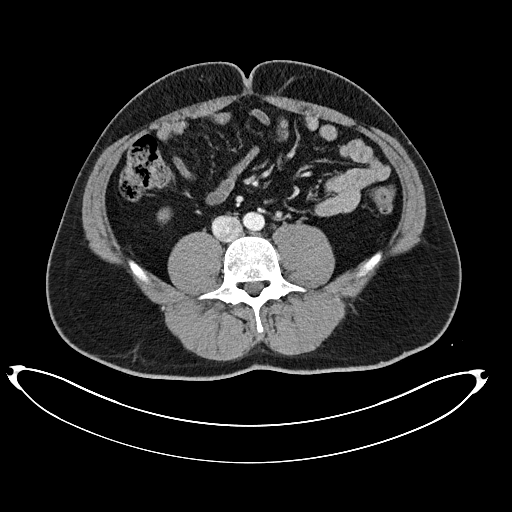

[Series 3: pelvis 2.00 cor · coronal · 0.66mm/px · 3 of 157 slices shown]
[im 53/157  soft-tissue]
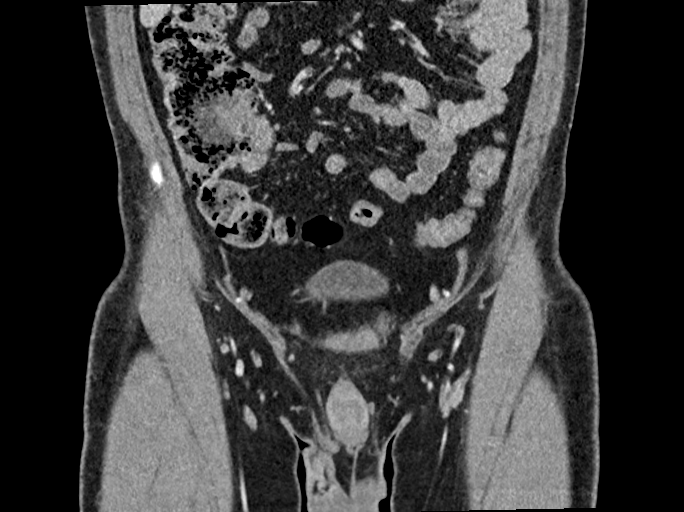
[im 70/157  soft-tissue]
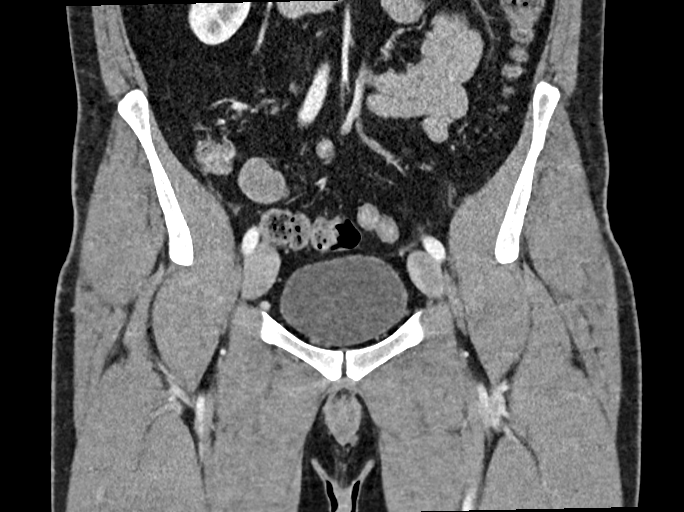
[im 87/157  soft-tissue]
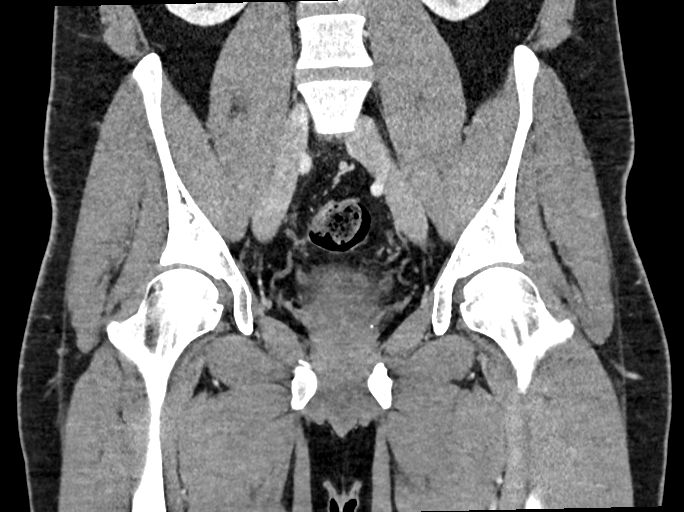

[17 of 46 positions shown; findings below may reference images not displayed]

FINDINGS: Urinary Tract:  No abnormality visualized.

Bowel:  Unremarkable visualized pelvic bowel loops.

Vascular/Lymphatic: No pathologically enlarged lymph nodes. No
significant vascular abnormality seen.

Reproductive: The prostate and seminal vesicles are grossly
unremarkable.

Other:  None

Musculoskeletal: No suspicious bone lesions identified.
IMPRESSION: Unremarkable CT of the pelvis. No lymphadenopathy.

## 2024-02-22 ENCOUNTER — Ambulatory Visit

## 2024-02-22 DIAGNOSIS — Z113 Encounter for screening for infections with a predominantly sexual mode of transmission: Secondary | ICD-10-CM

## 2024-02-22 LAB — HM HIV SCREENING LAB: HM HIV Screening: NEGATIVE

## 2024-02-22 NOTE — Progress Notes (Signed)
 " [facility] STI clinic [address], [address] [place] [address] Main phone: [phone]  STI screening visit  Subjective:  Micheal Ward is a 39 y.o. male being seen today for an STI screening visit. The patient reports they do not have symptoms.    Patient has the following medical conditions:  There are no active problems to display for this patient.  Chief Complaint  Patient presents with   SEXUALLY TRANSMITTED DISEASE   HPI Patient reports desire for STI testing. No exposures or symptoms.  Reproductive considerations: Does the patient or their partner desires a pregnancy in the next year? No  See flowsheet for further details and programmatic requirements  Hyperlink available at the top of the signed note in blue.  Flow sheet content below:  Pregnancy Intention Screening Does the patient want to become pregnant in the next year?: N/A Does the patient's partner want to become pregnant in the next year?: No Would the patient like to discuss contraceptive options today?: N/A Reason For STD Screen STD Screening: Is asymptomatic Have you ever had an STD?: No History of Antibiotic use in the past 2 weeks?: No STD Symptoms Denies all: Yes Risk Factors for Hep B Household, sexual, or needle sharing contact of a person infected with Hep B: No Sexual contact with a person who uses drugs not as prescribed?: No Currently or Ever used drugs not as prescribed: No HIV Positive: No PRep Patient: No Men who have sex with men: No Have Hepatitis C: No History of Incarceration: No History of Homeslessness?: No Anal sex following anal drug use?: No Risk Factors for Hep C Currently using drugs not as prescribed: No Sexual partner(s) currently using drugs as not prescribed: No History of drug use: No HIV Positive: No People with a history of incarceration: No People born between the years of 47 and 41: No Counseling Patient  counseled to use condoms with all sex: Condoms given RTC in 2-3 weeks for test results: Yes Clinic will call if test results abnormal before test result appt.: Yes Immunizations: Referred, Immunization history assessed Test results given to patient Patient counseled to use condoms with all sex: Condoms given  Screening for MPX risk:  Unexplained rash?  No   MSM?  No   Multiple or anonymous sex partners?  No   Any close or sexual contact with a person  diagnosed with MPX?  No   Any outside the [place]  where MPX is endemic?  No   High clinical suspicion for MPX?    -Unlikely to be chickenpox    -Lymphadenopathy    -Rash that presents in same phase of       evolution on any given body part  No   Does this patient meet CDC recommendations for vaccination against MPOX? No  You already have or anticipate having the following risks:  Your sex partner has the following risks: You're traveling to a county with a clade I MPOX outbreak and anticipate these risks: Occupational exposure  You had known or suspected exposure to someone with monkeypox You had a sex partner in the past 2 weeks who was diagnosed with monkeypox You are a gay, bisexual, or other man who has sex with men, or are transgender or nonbinary and in the past 6 months have had any of the following: - A new diagnosis of one or more sexually transmitted diseases (e.g., chlamydia, gonorrhea, or syphilis) - More than one sex partner You have had  any of the following in the past 6 months: - Sex at a commercial sex venue (like a sex club or bathhouse) - Sex related to a large commercial event   or in a geographic area (city or county for example) where mpox virus transmission is occurring Sex with a new partner Sex at a commercial sex venue (e.g., a sex club or bathhouse) Sex in it consultant for money, goods, drugs, or other trade Sex in association with a large public event (e.g., a rave, party, or festival) i.e. certain people who work in a  tax adviser or healthcare facility   Infectious disease screenings: Vaccinated against HPV? Unknown  HIV Ever had a positive? No Last test: unknown Results in chart:  No results found for: HMHIVSCREEN No results found for: HIV   Hep B Hep B status: unknown or no prior testing Received HBV vaccination? Yes Received HBV testing for immunity? Unknown Results in chart:  No components found for: HMHEPBSCREEN  Do they qualify for HBV screening today? No Criteria:  -Household, sexual or needle sharing contact with HBV -History of drug use or homelessness -HIV positive -Those with known Hep C  Hep C Hep C status: unknown or no prior testing Results in chart:  No results found for: HMHEPCSCREEN No components found for: HEPC  Do they qualify for HCV screening today? No Criteria - since the last HCV result, does the patient have any of the following? - Current drug use - Have a partner with drug use - Has been incarcerated  Immunization history:  Immunization History  Administered Date(s) Administered   Moderna Sars-Covid-2 Vaccination 04/01/2019, 04/30/2019    The following portions of the patient's history were reviewed and updated as appropriate: allergies, current medications, past medical history, past social history, past surgical history and problem list.  Substance use screenings:  Uses tobacco products? No Uses vapes? No Uses alcohol? No Uses non-injectable substances that alter your mental status? No Uses non-prescribed injectable substances? No  Immunization History  Administered Date(s) Administered   Moderna Sars-Covid-2 Vaccination 04/01/2019, 04/30/2019    The following portions of the patient's history were reviewed and updated as appropriate: allergies, current medications, past medical history, past social history, past surgical history and problem list.  Objective:  There were no vitals filed for this visit.  Physical Exam Vitals and nursing  note reviewed.  Constitutional:      Appearance: Normal appearance.  HENT:     Head: Normocephalic and atraumatic.     Comments: No nits or hair loss of scalp, brows, and lashes    Mouth/Throat:     Mouth: Mucous membranes are moist.     Pharynx: Oropharynx is clear. No oropharyngeal exudate or posterior oropharyngeal erythema.  Eyes:     General:        Right eye: No discharge.        Left eye: No discharge.     Conjunctiva/sclera: Conjunctivae normal.     Right eye: Right conjunctiva is not injected. No exudate.    Left eye: Left conjunctiva is not injected. No exudate. Pulmonary:     Effort: Pulmonary effort is normal.  Genitourinary:    Comments: Politely declined genital exam. Lymphadenopathy:     Cervical: No cervical adenopathy.     Upper Body:     Right upper body: No supraclavicular adenopathy.     Left upper body: No supraclavicular adenopathy.     Comments: Patient declines genital exam. Inguinal lymph nodes not assessed.   Skin:  General: Skin is warm and dry.     Findings: No lesion or rash.  Neurological:     Mental Status: He is alert and oriented to person, place, and time.    Assessment and Plan:  Micheal Ward is a 39 y.o. male presenting to the [facility] for STI screening.  Patient accepted the following screenings: oral GC culture, urine CT/GC, HIV, and RPR  1. Screening for venereal disease (Primary)  - Chlamydia/GC NAA, Confirmation - Gonococcus culture - HIV [facility] - Syphilis Serology, [facility]   Counseling: Recommended condom use with all sex Discussed importance of condom use for STI prevention Discussed time line for [facility] results and that patient will be called with positive results and encouraged patient to call if they had not heard in 2 weeks Recommended repeat testing in 3 months with positive results. Recommended returning for continued or worsening symptoms.   Return for STI testing as  needed.  No future appointments.  Damien FORBES Satchel, NP "

## 2024-02-23 NOTE — Progress Notes (Signed)
 Pt is here STD screening. Condoms given. Sonda Primes, RN.

## 2024-02-25 LAB — CHLAMYDIA/GC NAA, CONFIRMATION
Chlamydia trachomatis, NAA: NEGATIVE
Neisseria gonorrhoeae, NAA: NEGATIVE

## 2024-02-26 LAB — GONOCOCCUS CULTURE
